# Patient Record
Sex: Male | Born: 1995 | Hispanic: Yes | Marital: Married | Smoking: Never smoker
Health system: Southern US, Community
[De-identification: ages and names within clinical notes are randomized; demographics above are authoritative.]

---

## 2021-01-29 ENCOUNTER — Encounter (HOSPITAL_COMMUNITY): Payer: Self-pay | Admitting: Emergency Medicine

## 2021-01-29 ENCOUNTER — Emergency Department (HOSPITAL_COMMUNITY)
Admission: EM | Admit: 2021-01-29 | Discharge: 2021-01-30 | Disposition: A | Discharge status: Home / Self Care | Payer: Self-pay | Attending: Emergency Medicine | Admitting: Emergency Medicine

## 2021-01-29 ENCOUNTER — Emergency Department (HOSPITAL_COMMUNITY): Admission: EM | Admit: 2021-01-29 | Discharge: 2021-01-29 | Discharge status: Against Medical Advice | Payer: Self-pay

## 2021-01-29 ENCOUNTER — Other Ambulatory Visit: Payer: Self-pay

## 2021-01-29 DIAGNOSIS — W268XXA Contact with other sharp object(s), not elsewhere classified, initial encounter: Secondary | ICD-10-CM | POA: Insufficient documentation

## 2021-01-29 DIAGNOSIS — S61211A Laceration without foreign body of left index finger without damage to nail, initial encounter: Secondary | ICD-10-CM | POA: Insufficient documentation

## 2021-01-29 DIAGNOSIS — Z5321 Procedure and treatment not carried out due to patient leaving prior to being seen by health care provider: Secondary | ICD-10-CM | POA: Insufficient documentation

## 2021-01-29 DIAGNOSIS — Y990 Civilian activity done for income or pay: Secondary | ICD-10-CM | POA: Insufficient documentation

## 2021-01-29 NOTE — ED Notes (Signed)
Pt left AMA °

## 2021-01-29 NOTE — ED Triage Notes (Signed)
While working on his car, the patient cut his left index finger. The laceration is a little over an inch long. Bleeding has been controlled.

## 2021-01-30 ENCOUNTER — Encounter (HOSPITAL_COMMUNITY): Payer: Self-pay | Admitting: Emergency Medicine

## 2021-01-30 ENCOUNTER — Other Ambulatory Visit: Payer: Self-pay

## 2021-01-30 ENCOUNTER — Emergency Department (HOSPITAL_COMMUNITY): Payer: Self-pay

## 2021-01-30 ENCOUNTER — Emergency Department (HOSPITAL_COMMUNITY)
Admission: EM | Admit: 2021-01-30 | Discharge: 2021-01-30 | Disposition: A | Discharge status: Home / Self Care | Payer: Self-pay | Attending: Emergency Medicine | Admitting: Emergency Medicine

## 2021-01-30 DIAGNOSIS — W275XXA Contact with paper-cutter, initial encounter: Secondary | ICD-10-CM | POA: Insufficient documentation

## 2021-01-30 DIAGNOSIS — S61211A Laceration without foreign body of left index finger without damage to nail, initial encounter: Secondary | ICD-10-CM | POA: Insufficient documentation

## 2021-01-30 DIAGNOSIS — Z23 Encounter for immunization: Secondary | ICD-10-CM | POA: Insufficient documentation

## 2021-01-30 MED ORDER — TETANUS-DIPHTH-ACELL PERTUSSIS 5-2.5-18.5 LF-MCG/0.5 IM SUSY
0.5000 mL | PREFILLED_SYRINGE | Freq: Once | INTRAMUSCULAR | Status: AC
  Administered 2021-01-30: 0.5 mL via INTRAMUSCULAR
  Filled 2021-01-30: qty 0.5

## 2021-01-30 MED ORDER — BACITRACIN ZINC 500 UNIT/GM EX OINT
TOPICAL_OINTMENT | Freq: Two times a day (BID) | CUTANEOUS | Status: DC
  Administered 2021-01-30: 1 via TOPICAL
  Filled 2021-01-30: qty 0.9

## 2021-01-30 MED ORDER — BACITRACIN ZINC 500 UNIT/GM EX OINT: 1.0000 "application " | TOPICAL_OINTMENT | Freq: Two times a day (BID) | CUTANEOUS | 0 refills | Status: DC

## 2021-01-30 NOTE — Discharge Instructions (Addendum)
You were seen in the ER for a laceration.  The x-ray did not show a fracture. It did show what might be a very small foreign body, your wound was cleansed and irrigated, it remains possible that this is still there however it was not visualized.   Please follow attached wound care instructions.  Apply bacitracin twice per day.  Keep the area clean & dry after washing with mild soapy water.  Wear the splint for protection.   Follow up with primary care for wound recheck and blood pressure recheck within 3 days.  Return to the ER for new or worsening symptoms including but not limited to new or worsening pain, increased redness/swelling/drainage from the wound, fever, trouble bending the finger, or any other concerns.

## 2021-01-30 NOTE — ED Triage Notes (Signed)
Patient presents with laceration to L index finger from box cutter yesterday morning. Movement and sensation present.

## 2021-01-30 NOTE — ED Provider Notes (Signed)
[facility]-EMERGENCY DEPT Provider Note   CSN: [id number] Arrival date & time: 01/30/21  1819     History Chief Complaint  Patient presents with   Laceration    Stephen Woodward is a 25 y.o. male who presents to the emergency department with left second digit laceration which occurred yesterday morning.  Patient states that he was cutting open something and accidentally slipped and cut his finger.  The wound was larger but has seemed to improve since the injury.  It is mildly sore but not overtly painful.  No alleviating or aggravating factors.  He denies numbness, tingling, or weakness. Has not had any increased redness/drainage to the wound. Unknown last tetanus.  He is right-hand dominant.  HPI     History reviewed. No pertinent past medical history.  There are no problems to display for this patient.   History reviewed. No pertinent surgical history.     No family history on file.  Social History   Tobacco Use   Smoking status: Never   Smokeless tobacco: Never    Home Medications Prior to Admission medications   Not on File    Allergies    Patient has no known allergies.  Review of Systems   Review of Systems  Constitutional:  Negative for chills and fever.  Cardiovascular:  Negative for chest pain.  Gastrointestinal:  Negative for abdominal pain.  Musculoskeletal:  Positive for arthralgias.  Skin:  Positive for wound. Negative for color change.  Neurological:  Negative for weakness and numbness.  All other systems reviewed and are negative.  Physical Exam Updated Vital Signs BP (!) 174/73 (BP Location: Right Arm)   Pulse 74   Temp 98 F (36.7 C) (Oral)   Resp 16   SpO2 100%   Physical Exam Vitals and nursing note reviewed.  Constitutional:      General: He is not in acute distress.    Appearance: He is well-developed.  HENT:     Head: Normocephalic and atraumatic.  Eyes:     General:        Right eye: No discharge.         Left eye: No discharge.     Conjunctiva/sclera: Conjunctivae normal.  Cardiovascular:     Comments: 2+ symmetric radial pulses. Pulmonary:     Effort: Pulmonary effort is normal.  Musculoskeletal:     Comments: Upper extremities: Patient has a 2.75 cm linear laceration that runs diagonally to the radial aspect of the second left PIP joint.  This is 2 mm deep.  There is no active bleeding.  The wound has started to come together some.  There is no surrounding erythema, warmth, or purulent drainage.  Patient has intact active range of motion throughout the MCP/IP joints.  Mild tenderness over the wound.  Otherwise nontender.  Able to flex/extend the left second MCP/IP joints against resistance.  Skin:    General: Skin is warm and dry.     Capillary Refill: Capillary refill takes less than 2 seconds.  Neurological:     Mental Status: He is alert.     Comments: Clear speech.  Sensation grossly intact bilateral upper extremities.  5 out of 5 symmetric grip strength.  Able to form okay sign, thumbs up, cross second/third digits bilaterally   Psychiatric:        Behavior: Behavior normal.        Thought Content: Thought content normal.    ED Results / Procedures / Treatments   Labs (  all labs ordered are listed, but only abnormal results are displayed) Labs Reviewed - No data to display  EKG None  Radiology DG Finger Index Left  Result Date: 01/30/2021 CLINICAL DATA:  Second digit laceration, initial encounter EXAM: LEFT INDEX FINGER 2+V COMPARISON:  None. FINDINGS: No acute fracture or dislocation is noted. Tiny radiopaque density is noted adjacent to the second proximal phalanx in the soft tissues which may be related to the recent injury. Correlation with the clinical exam is recommended. No other focal abnormality is seen. IMPRESSION: No acute bony abnormality noted. Tiny radiodensity which may be related to the recent injury. Electronically Signed   By: Alcide Clever M.D.   On:  01/30/2021 20:29    Procedures Procedures   Medications Ordered in ED Medications - No data to display  ED Course  I have reviewed the triage vital signs and the nursing notes.  Pertinent labs & imaging results that were available during my care of the patient were reviewed by me and considered in my medical decision making (see chart for details).    MDM Rules/Calculators/A&P                          Patient presents to the ED with complaints of left 2nd finger laceration that occurred 36 hours prior.  Nontoxic, vitals WNL with exception of elevated blood pressure, doubt hypertensive emergency, this will need PCP recheck.  Additional history obtained:  Additional history obtained from chart review & nursing note review.   Imaging Studies ordered:  Left index finger x-ray ordered by triage, I independently reviewed, formal radiology impression shows: No acute bony abnormality noted. Tiny radiodensity which may be related to the recent injury.  ED Course:  On exam there is no signs of infection NVI distally.  Able to flex/extend against resistance- doubt tendon injury.  No fx/dislocation on x-ray.  X-ray does mention tiny radiodensity which may be related to the recent injury, no visible foreign body on exam.  The area was cleansed with Betadine, pressure irrigated with 1 L of sterile water.  Wound visualized in a bloodless field without evidence of visible foreign body.  Given wound occurred greater than 36 hours ago at this time do not feel that closure benefit would outweigh risk of infection at this point.  Antibiotic ointment placed with dressing and splint for protection.  We discussed wound care. Will need to heal via secondary intention.  Tetanus updated in the ER.  PCP follow-up.  I discussed results, treatment plan, need for follow-up, and return precautions with the patient. Provided opportunity for questions, patient confirmed understanding and is in agreement with plan.     Portions of this note were generated with Scientist, clinical (histocompatibility and immunogenetics). Dictation errors may occur despite best attempts at proofreading.  Final Clinical Impression(s) / ED Diagnoses Final diagnoses:  Laceration of left index finger without damage to nail, foreign body presence unspecified, initial encounter    Rx / DC Orders ED Discharge Orders          Ordered    bacitracin ointment  2 times daily        01/30/21 2229             Cherly Anderson, PA-C 01/30/21 2233    Rolan Bucco, MD 01/30/21 2336

## 2022-01-23 IMAGING — CR DG FINGER INDEX 2+V*L*
3 series · 3 of 3 positions shown · non-contrast
Comparison: None.

CLINICAL DATA: Second digit laceration, initial encounter

EXAM:
LEFT INDEX FINGER 2+V

[x finger pa left]
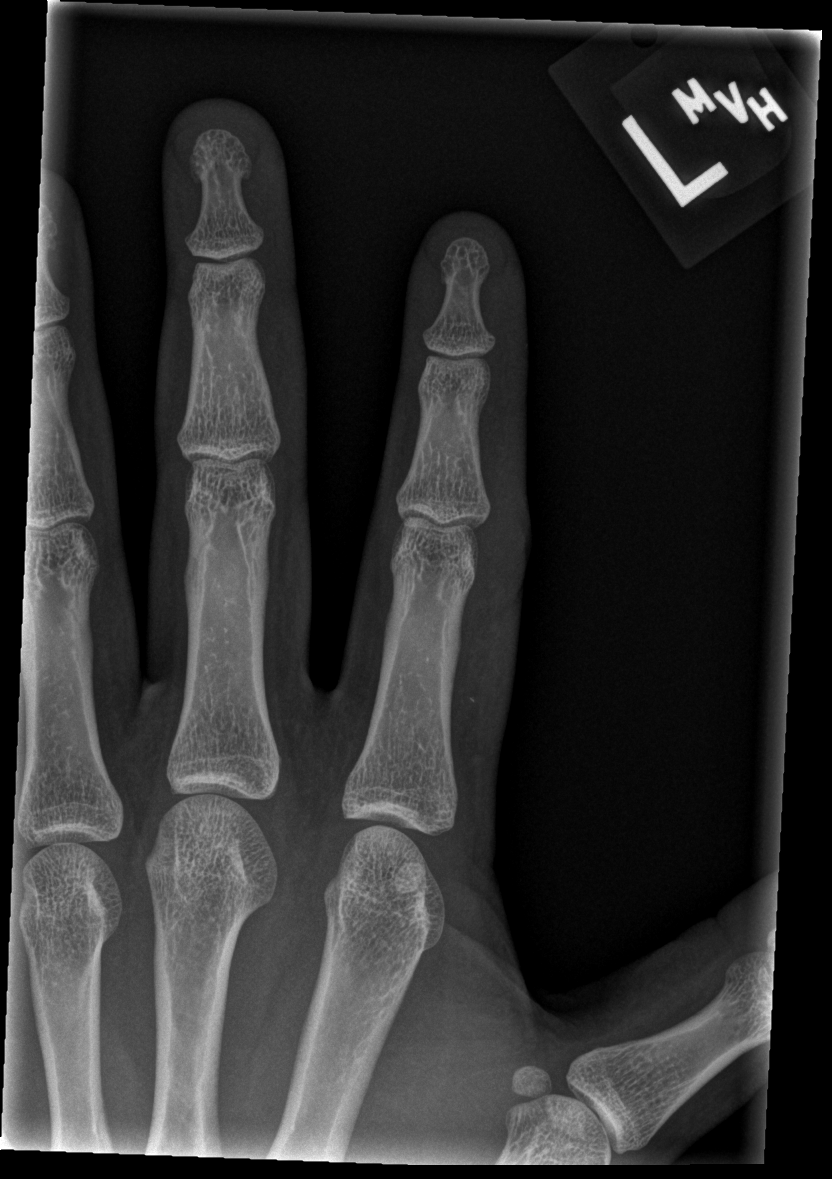

[x finger obl left]
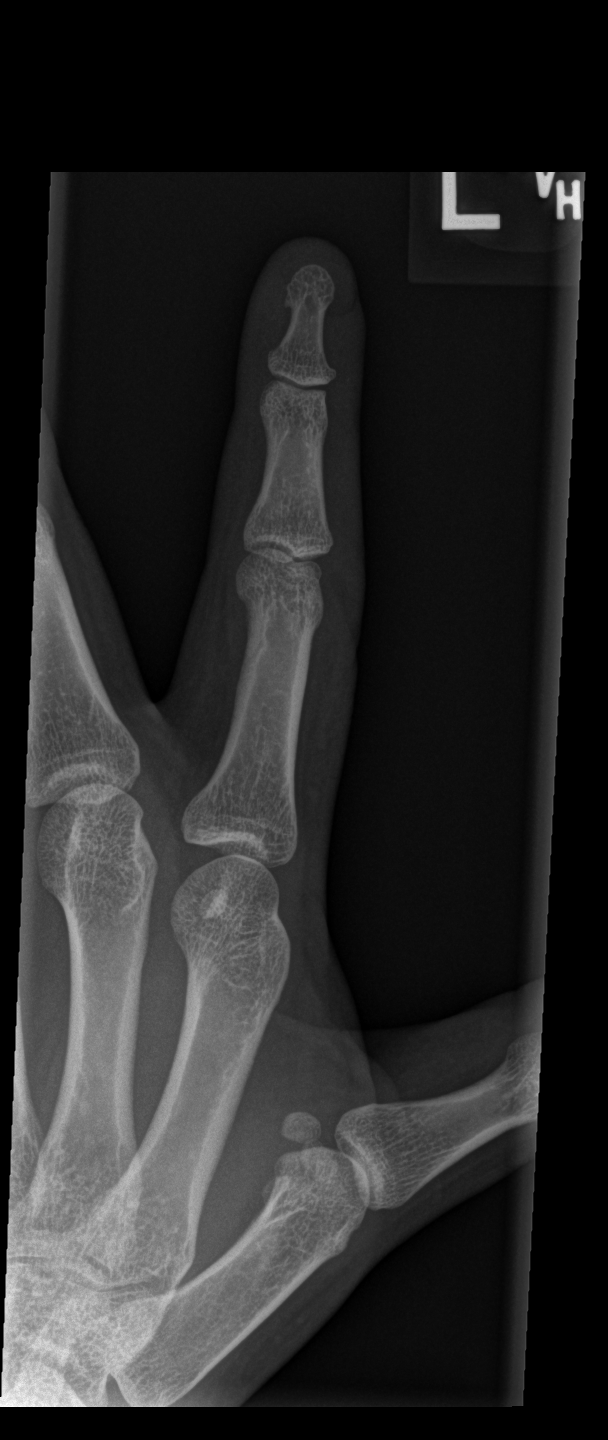

[x finger lat left]
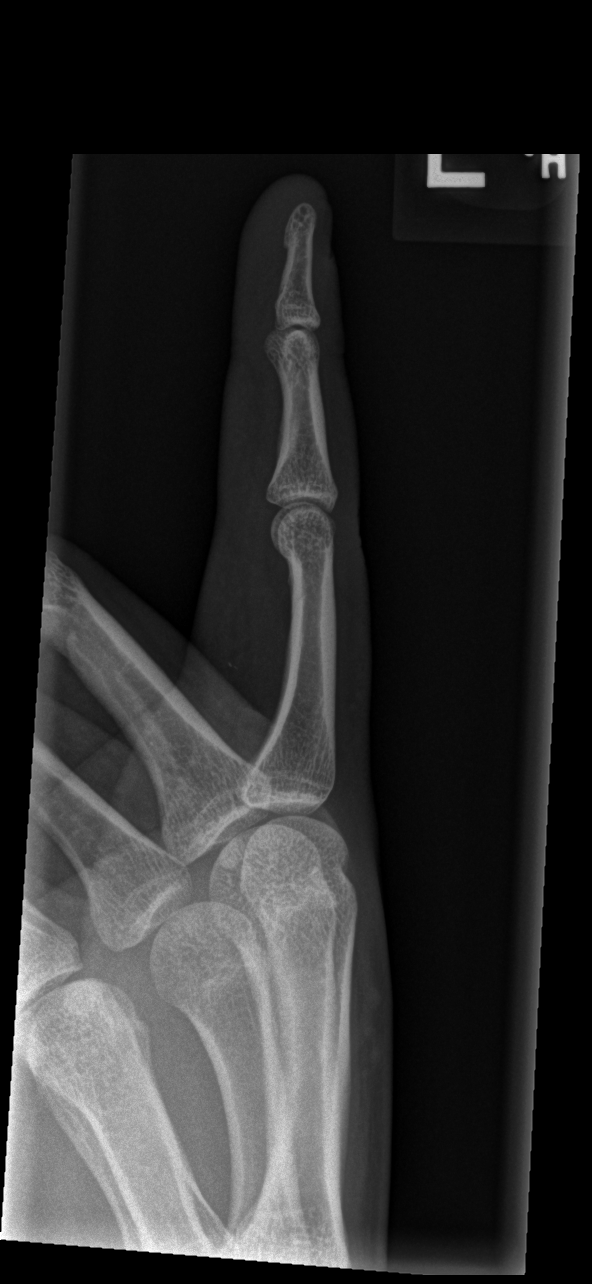

[3 of 3 positions shown; findings below may reference images not displayed]

FINDINGS: No acute fracture or dislocation is noted. Tiny radiopaque density
is noted adjacent to the second proximal phalanx in the soft tissues
which may be related to the recent injury. Correlation with the
clinical exam is recommended. No other focal abnormality is seen.
IMPRESSION: No acute bony abnormality noted.

Tiny radiodensity which may be related to the recent injury.

## 2024-03-07 ENCOUNTER — Emergency Department (HOSPITAL_COMMUNITY)

## 2024-03-07 ENCOUNTER — Other Ambulatory Visit: Payer: Self-pay

## 2024-03-07 ENCOUNTER — Emergency Department (HOSPITAL_COMMUNITY)
Admission: EM | Admit: 2024-03-07 | Discharge: 2024-03-07 | Disposition: A | Discharge status: Home / Self Care | Attending: Emergency Medicine | Admitting: Emergency Medicine

## 2024-03-07 DIAGNOSIS — D72829 Elevated white blood cell count, unspecified: Secondary | ICD-10-CM | POA: Insufficient documentation

## 2024-03-07 DIAGNOSIS — K529 Noninfective gastroenteritis and colitis, unspecified: Secondary | ICD-10-CM | POA: Insufficient documentation

## 2024-03-07 DIAGNOSIS — X58XXXA Exposure to other specified factors, initial encounter: Secondary | ICD-10-CM | POA: Diagnosis not present

## 2024-03-07 DIAGNOSIS — S39012A Strain of muscle, fascia and tendon of lower back, initial encounter: Secondary | ICD-10-CM | POA: Insufficient documentation

## 2024-03-07 DIAGNOSIS — R112 Nausea with vomiting, unspecified: Secondary | ICD-10-CM | POA: Diagnosis present

## 2024-03-07 LAB — COMPREHENSIVE METABOLIC PANEL WITH GFR
ALT: 37 U/L (ref 0–44)
AST: 28 U/L (ref 15–41)
Albumin: 4.1 g/dL (ref 3.5–5.0)
Alkaline Phosphatase: 80 U/L (ref 38–126)
Anion gap: 9 (ref 5–15)
BUN: 10 mg/dL (ref 6–20)
CO2: 24 mmol/L (ref 22–32)
Calcium: 9.4 mg/dL (ref 8.9–10.3)
Chloride: 106 mmol/L (ref 98–111)
Creatinine, Ser: 1.01 mg/dL (ref 0.61–1.24)
GFR, Estimated: >60 mL/min (ref >60)
Glucose, Bld: 126 mg/dL — ABNORMAL HIGH (ref 70–99)
Potassium: 4.1 mmol/L (ref 3.5–5.1)
Sodium: 139 mmol/L (ref 135–145)
Total Bilirubin: 1.1 mg/dL (ref 0.0–1.2)
Total Protein: 7.8 g/dL (ref 6.5–8.1)

## 2024-03-07 LAB — CBC
HCT: 51.2 % (ref 39.0–52.0)
Hemoglobin: 17.7 g/dL — ABNORMAL HIGH (ref 13.0–17.0)
MCH: 29.3 pg (ref 26.0–34.0)
MCHC: 34.6 g/dL (ref 30.0–36.0)
MCV: 84.6 fL (ref 80.0–100.0)
Platelets: 288 K/uL (ref 150–400)
RBC: 6.05 MIL/uL — ABNORMAL HIGH (ref 4.22–5.81)
RDW: 12.1 % (ref 11.5–15.5)
WBC: 14.2 K/uL — ABNORMAL HIGH (ref 4.0–10.5)
nRBC: 0 % (ref 0.0–0.2)

## 2024-03-07 LAB — LIPASE, BLOOD: Lipase: 26 U/L (ref 11–51)

## 2024-03-07 MED ORDER — KETOROLAC TROMETHAMINE 15 MG/ML IJ SOLN
15.0000 mg | Freq: Once | INTRAMUSCULAR | Status: AC
  Administered 2024-03-07: 15 mg via INTRAVENOUS
  Filled 2024-03-07: qty 1

## 2024-03-07 MED ORDER — SODIUM CHLORIDE 0.9 % IV BOLUS
1000.0000 mL | Freq: Once | INTRAVENOUS | Status: AC
  Administered 2024-03-07: 1000 mL via INTRAVENOUS

## 2024-03-07 MED ORDER — LOPERAMIDE HCL 2 MG PO CAPS: 2.0000 mg | ORAL_CAPSULE | Freq: Four times a day (QID) | ORAL | 0 refills | Status: AC | PRN

## 2024-03-07 MED ORDER — ONDANSETRON 4 MG PO TBDP: 4.0000 mg | ORAL_TABLET | Freq: Three times a day (TID) | ORAL | 0 refills | Status: AC | PRN

## 2024-03-07 MED ORDER — ONDANSETRON HCL 4 MG/2ML IJ SOLN
4.0000 mg | Freq: Once | INTRAMUSCULAR | Status: AC
  Administered 2024-03-07: 4 mg via INTRAVENOUS
  Filled 2024-03-07: qty 2

## 2024-03-07 MED ORDER — LOPERAMIDE HCL 2 MG PO CAPS: 4.0000 mg | ORAL_CAPSULE | Freq: Once | ORAL | Status: DC | PRN

## 2024-03-07 MED ORDER — DICYCLOMINE HCL 20 MG PO TABS: 20.0000 mg | ORAL_TABLET | Freq: Two times a day (BID) | ORAL | 0 refills | Status: AC

## 2024-03-07 NOTE — ED Provider Notes (Signed)
 [place] EMERGENCY DEPARTMENT AT [facility] Provider Note   CSN: [id number] Arrival date & time: 03/07/24  1153     Patient presents with: No chief complaint on file.   Stephen Woodward is a 28 y.o. male with noncontributory past medical history reports to emergency room with chief complaint of nausea vomiting diarrhea.  Patient reports that last night he ate his mom's cooking with shrimp and about 6 hours later started developing abdominal cramping nausea vomiting and diarrhea.  Notes over 10 episodes of nonbilious nonbloody vomit.  Reports over 10 episodes of loose nonbloody stool.  Patient reports that he is no longer having loose stool but continues to be nauseous and feeling he is going to vomit.  Has not really tolerated eating anything since this all started last night.  Also notes that he fell down the stairs about a year ago and has had some intermittent left-sided low back pain since then.  He is hoping he can get this checked out as well.  He denies any recent injury or fall.  Denies saddle anesthesia, loss of bowel or bladder, fever or history of IV drug use or cancer.   HPI     Prior to Admission medications   Medication Sig Start Date End Date Taking? Authorizing Provider  bacitracin  ointment Apply 1 application topically 2 (two) times daily. 01/30/21   Petrucelli, Samantha R, PA-C    Allergies: Patient has no known allergies.    Review of Systems  Gastrointestinal:  Positive for vomiting.    Updated Vital Signs There were no vitals taken for this visit.  Physical Exam Vitals and nursing note reviewed.  Constitutional:      General: He is not in acute distress.    Appearance: He is not toxic-appearing.  HENT:     Head: Normocephalic and atraumatic.  Eyes:     General: No scleral icterus.    Conjunctiva/sclera: Conjunctivae normal.  Cardiovascular:     Rate and Rhythm: Regular rhythm. Tachycardia present.     Pulses: Normal pulses.     Heart  sounds: Normal heart sounds.  Pulmonary:     Effort: Pulmonary effort is normal. No respiratory distress.     Breath sounds: Normal breath sounds.  Abdominal:     General: Abdomen is flat. Bowel sounds are normal. There is no distension.     Palpations: Abdomen is soft. There is no mass.     Tenderness: There is no abdominal tenderness.     Comments: Abdomen is soft, nontender.  Musculoskeletal:     Right lower leg: No edema.     Left lower leg: No edema.     Comments: Left sided lumbar paravertebral muscular tenderness. Worse with twisting side to side. Lower ext strength and sensation is intact bilaterally.  Skin:    General: Skin is warm and dry.     Findings: No lesion.  Neurological:     General: No focal deficit present.     Mental Status: He is alert and oriented to person, place, and time. Mental status is at baseline.     (all labs ordered are listed, but only abnormal results are displayed) Labs Reviewed  COMPREHENSIVE METABOLIC PANEL WITH GFR - Abnormal; Notable for the following components:      Result Value   Glucose, Bld 126 (*)    All other components within normal limits  CBC - Abnormal; Notable for the following components:   WBC 14.2 (*)    RBC 6.05 (*)  Hemoglobin 17.7 (*)    All other components within normal limits  LIPASE, BLOOD  URINALYSIS, ROUTINE W REFLEX MICROSCOPIC    EKG: None  Radiology: DG Lumbar Spine Complete Result Date: 03/07/2024 CLINICAL DATA:  Fall and back pain. EXAM: LUMBAR SPINE - COMPLETE 4+ VIEW COMPARISON:  None Available. FINDINGS: Five lumbar type vertebra. There is no acute fracture or subluxation of the lumbar spine. The vertebral body heights and disc spaces are maintained. The visualized posterior elements are intact. The soft tissues are unremarkable. IMPRESSION: Negative. Electronically Signed   By: Vanetta Chou M.D.   On: 03/07/2024 13:29     Procedures   Medications Ordered in the ED  loperamide  (IMODIUM )  capsule 4 mg (has no administration in time range)  sodium chloride  0.9 % bolus 1,000 mL (0 mLs Intravenous Stopped 03/07/24 1340)  ketorolac  (TORADOL ) 15 MG/ML injection 15 mg (15 mg Intravenous Given 03/07/24 1231)  ondansetron  (ZOFRAN ) injection 4 mg (4 mg Intravenous Given 03/07/24 1231)  sodium chloride  0.9 % bolus 1,000 mL (1,000 mLs Intravenous New Bag/Given 03/07/24 1340)    Clinical Course as of 03/07/24 1444  Sun Mar 07, 2024  1442 Reassessed and feeling better. Tolerating oral intake. Stable for d/c with sx control and OP f/u. [JB]    Clinical Course User Index [JB] Stryder Poitra, Warren SAILOR, PA-C                                 Medical Decision Making Amount and/or Complexity of Data Reviewed Labs: ordered. Radiology: ordered.  Risk Prescription drug management.   This patient presents to the ED for concern of NVD, this involves an extensive number of treatment options, and is a complaint that carries with it a high risk of complications and morbidity.  The differential diagnosis includes appendicitis, diverticulitis, dehydration, electrolyte abnormality, gastroenteritis   Lab Tests:  I personally interpreted labs.  The pertinent results include:   Mild leukocytosis, hbg 17 -- likely hemo concentrated from volume loss.  Cmp no electrolyte abnormality.  Normal kidney and liver function.  Lipase 26   Imaging Studies ordered:  Considered but nonfocal abd exam.   Cardiac Monitoring: / EKG:  The patient was maintained on a cardiac monitor.    Problem List / ED Course / Critical interventions / Medication management  Patient reports to emergency room with nausea vomiting diarrhea after suspicious food intake yesterday.  On arrival he is slightly tachycardic but hemodynamically stable and well-appearing.  On my abdominal exam his abdomen is soft nondistended nontender thus do not feel imaging is needed today.  He was given nausea medicine, Toradol  and 2 L normal saline.  Vital  signs improved and symptoms also improved.  He had no episode of vomiting or diarrhea during duration of stay in ER.  Labs overall reassuring. Patient also notes he has had intermittent back pain for about a year after fall downstairs.  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  On my exam he has left-sided muscular tenderness.  He has no midline tenderness step-off or deformity.  His x-ray of lumbar spine today is negative.  Will have him try over-the-counter management and follow-up with primary care for recheck of symptoms.  No red flag symptoms associate with his back pain. I ordered medication including Zofran , Imodium  Reevaluation of the patient after these medicines showed that the patient improved I have reviewed the patients home medicines and have made  adjustments as needed   Plan F/u w/ PCP in 2-3d to ensure resolution of sx.  Patient was given return precautions. Patient stable for discharge at this time.  Patient educated on sx/dx and verbalized understanding of plan. Return to ER w/ new or worsening sx.       Final diagnoses:  Gastroenteritis  Strain of lumbar region, initial encounter    ED Discharge Orders          Ordered    ondansetron  (ZOFRAN -ODT) 4 MG disintegrating tablet  Every 8 hours PRN        03/07/24 1413    loperamide  (IMODIUM ) 2 MG capsule  4 times daily PRN        03/07/24 1413    dicyclomine  (BENTYL ) 20 MG tablet  2 times daily        03/07/24 1413               Kaytelyn Glore, Warren SAILOR, PA-C 03/07/24 1444    Yolande Lamar BROCKS, MD 03/07/24 1958

## 2024-03-07 NOTE — ED Triage Notes (Addendum)
 Pt complains of n/v/d that started at midnight. Pt states that he ate some food that was bad. Pt has vomited 7x and diarrhea 15x since midnight. Denies any chest pain, dizziness, or vision changes now. He endorses shob and had dizziness earlier in the morning.  The pt also complains of back pain down his spine and in his lower back x1 year. He is requesting the doctor to help with it, 10/10.

## 2024-03-07 NOTE — Discharge Instructions (Addendum)
 Take Zofran  as needed for nausea or vomiting. Take Bentyl  as needed for abdominal cramping. Take Imodium  as needed for loose stool. Make sure you are staying well-hydrated with primarily water you can alternate Gatorade or Pedialyte.  Try bland foods like rice or crackers.  Follow-up with primary care or return to ER with new or worsening symptoms.
